# Patient Record
Sex: Male | Born: 1975 | Race: Black or African American | Hispanic: No | Marital: Single | Smoking: Current every day smoker
Health system: Southern US, Community
[De-identification: ages and names within clinical notes are randomized; demographics above are authoritative.]

## PROBLEM LIST (undated history)

## (undated) DIAGNOSIS — J45909 Unspecified asthma, uncomplicated: Secondary | ICD-10-CM

## (undated) HISTORY — PX: FEMUR FRACTURE SURGERY: SHX633

## (undated) HISTORY — PX: BACK SURGERY: SHX140

---

## 2020-05-02 ENCOUNTER — Emergency Department (HOSPITAL_COMMUNITY)
Admission: EM | Admit: 2020-05-02 | Discharge: 2020-05-02 | Disposition: A | Discharge status: Home / Self Care | Payer: Self-pay | Attending: Emergency Medicine | Admitting: Emergency Medicine

## 2020-05-02 ENCOUNTER — Other Ambulatory Visit: Payer: Self-pay

## 2020-05-02 ENCOUNTER — Encounter (HOSPITAL_COMMUNITY): Payer: Self-pay

## 2020-05-02 DIAGNOSIS — J45909 Unspecified asthma, uncomplicated: Secondary | ICD-10-CM | POA: Insufficient documentation

## 2020-05-02 DIAGNOSIS — H60502 Unspecified acute noninfective otitis externa, left ear: Secondary | ICD-10-CM | POA: Insufficient documentation

## 2020-05-02 HISTORY — DX: Unspecified asthma, uncomplicated: J45.909

## 2020-05-02 MED ORDER — AMOXICILLIN 500 MG PO TABS: 1000.0000 mg | ORAL_TABLET | Freq: Two times a day (BID) | ORAL | 0 refills | Status: AC

## 2020-05-02 MED ORDER — OFLOXACIN 0.3 % OP SOLN: 1.0000 [drp] | Freq: Four times a day (QID) | OPHTHALMIC | 0 refills | Status: DC

## 2020-05-02 NOTE — ED Triage Notes (Signed)
Pt reports left ear pain that began this morning.

## 2020-05-02 NOTE — ED Provider Notes (Signed)
[facility]-EMERGENCY DEPT Provider Note   CSN: [id number] Arrival date & time: 05/02/20  2220     History Chief Complaint  Patient presents with  . Otalgia    Paul Jones is a 45 y.o. male.  45 yo M with a chief complaint of left ear pain.  This started this evening.  The patient works in a [employer] and has to wear double here protection.  He thinks maybe his ear plug had gotten in an awkward position that caused him to have pain.  He denies any other foreign body to the ear.  Denies cough congestion or fever.  Denies trauma to the ear.  The history is provided by the patient.  Illness Severity:  Moderate Onset quality:  Gradual Duration:  1 day Timing:  Constant Progression:  Worsening Chronicity:  New Associated symptoms: ear pain   Associated symptoms: no abdominal pain, no chest pain, no congestion, no diarrhea, no fever, no headaches, no myalgias, no rash, no shortness of breath and no vomiting        Past Medical History:  Diagnosis Date  . Asthma     There are no problems to display for this patient.   History reviewed. No pertinent surgical history.     History reviewed. No pertinent family history.     Home Medications Prior to Admission medications   Medication Sig Start Date End Date Taking? Authorizing Provider  amoxicillin (AMOXIL) 500 MG tablet Take 2 tablets (1,000 mg total) by mouth 2 (two) times daily. 05/02/20  Yes Melene Plan, DO  ofloxacin (OCUFLOX) 0.3 % ophthalmic solution Place 1 drop into the left ear 4 (four) times daily. 05/02/20  Yes Melene Plan, DO    Allergies    Patient has no known allergies.  Review of Systems   Review of Systems  Constitutional: Negative for chills and fever.  HENT: Positive for ear pain. Negative for congestion and facial swelling.   Eyes: Negative for discharge and visual disturbance.  Respiratory: Negative for shortness of breath.   Cardiovascular: Negative for chest pain and  palpitations.  Gastrointestinal: Negative for abdominal pain, diarrhea and vomiting.  Musculoskeletal: Negative for arthralgias and myalgias.  Skin: Negative for color change and rash.  Neurological: Negative for tremors, syncope and headaches.  Psychiatric/Behavioral: Negative for confusion and dysphoric mood.    Physical Exam Updated Vital Signs BP 107/75 (BP Location: Left Arm)   Pulse 97   Temp 98.1 F (36.7 C) (Oral)   Resp 16   SpO2 97%   Physical Exam Vitals and nursing note reviewed.  Constitutional:      Appearance: He is well-developed and well-nourished.  HENT:     Head: Normocephalic and atraumatic.     Ears:     Comments: Mild erythema to the right external ear canal and the tympanic membrane without obvious effusion or change in landmarks.  Left external ear canal with some erythema and edema compared to the right.  No obvious discharge.  The left tympanic membrane with a purulent appearing effusion with some distortion of landmarks. Eyes:     Extraocular Movements: EOM normal.     Pupils: Pupils are equal, round, and reactive to light.  Neck:     Vascular: No JVD.  Cardiovascular:     Rate and Rhythm: Normal rate and regular rhythm.     Heart sounds: No murmur heard. No friction rub. No gallop.   Pulmonary:     Effort: No respiratory distress.  Breath sounds: No wheezing.  Abdominal:     General: There is no distension.     Tenderness: There is no guarding or rebound.  Musculoskeletal:        General: Normal range of motion.     Cervical back: Normal range of motion and neck supple.  Skin:    Coloration: Skin is not pale.     Findings: No rash.  Neurological:     Mental Status: He is alert and oriented to person, place, and time.  Psychiatric:        Mood and Affect: Mood and affect normal.        Behavior: Behavior normal.     ED Results / Procedures / Treatments   Labs (all labs ordered are listed, but only abnormal results are  displayed) Labs Reviewed - No data to display  EKG None  Radiology No results found.  Procedures Procedures   Medications Ordered in ED Medications - No data to display  ED Course  I have reviewed the triage vital signs and the nursing notes.  Pertinent labs & imaging results that were available during my care of the patient were reviewed by me and considered in my medical decision making (see chart for details).    MDM Rules/Calculators/A&P                          44 yo M with a chief complaints of left ear pain.  Going on since this morning.  Patient with likely otitis externa but also some findings of otitis media.  Will start on oral and topical antibiotics.  Patient does not currently have a family physician in the area will give him ENT follow-up if needed.  11:10 PM:  I have discussed the diagnosis/risks/treatment options with the patient and believe the pt to be eligible for discharge home to follow-up with ENT. We also discussed returning to the ED immediately if new or worsening sx occur. We discussed the sx which are most concerning (e.g., sudden worsening pain, fever, inability to tolerate by mouth) that necessitate immediate return. Medications administered to the patient during their visit and any new prescriptions provided to the patient are listed below.  Medications given during this visit Medications - No data to display   The patient appears reasonably screen and/or stabilized for discharge and I doubt any other medical condition or other [facility] requiring further screening, evaluation, or treatment in the ED at this time prior to discharge.   Final Clinical Impression(s) / ED Diagnoses Final diagnoses:  Acute otitis externa of left ear, unspecified type    Rx / DC Orders ED Discharge Orders         Ordered    amoxicillin (AMOXIL) 500 MG tablet  2 times daily        05/02/20 2250    ofloxacin (OCUFLOX) 0.3 % ophthalmic solution  4 times daily         05/02/20 2250           Melene Plan, DO 05/02/20 2310

## 2020-05-02 NOTE — Discharge Instructions (Signed)
I provided you information to follow-up with the ear nose and throat doctor if your hearing worsens or if you are pain is not improved after a couple days.  You should also establish with a family doctor in the area.

## 2020-05-09 ENCOUNTER — Other Ambulatory Visit: Payer: Self-pay

## 2020-05-09 ENCOUNTER — Encounter (HOSPITAL_COMMUNITY): Payer: Self-pay | Admitting: Emergency Medicine

## 2020-05-09 ENCOUNTER — Emergency Department (HOSPITAL_COMMUNITY)
Admission: EM | Admit: 2020-05-09 | Discharge: 2020-05-09 | Disposition: A | Discharge status: Home / Self Care | Payer: Self-pay | Attending: Physician Assistant | Admitting: Physician Assistant

## 2020-05-09 DIAGNOSIS — H7292 Unspecified perforation of tympanic membrane, left ear: Secondary | ICD-10-CM | POA: Insufficient documentation

## 2020-05-09 DIAGNOSIS — H6692 Otitis media, unspecified, left ear: Secondary | ICD-10-CM | POA: Insufficient documentation

## 2020-05-09 DIAGNOSIS — Z79899 Other long term (current) drug therapy: Secondary | ICD-10-CM | POA: Insufficient documentation

## 2020-05-09 DIAGNOSIS — H6690 Otitis media, unspecified, unspecified ear: Secondary | ICD-10-CM

## 2020-05-09 DIAGNOSIS — J45909 Unspecified asthma, uncomplicated: Secondary | ICD-10-CM | POA: Insufficient documentation

## 2020-05-09 DIAGNOSIS — F17210 Nicotine dependence, cigarettes, uncomplicated: Secondary | ICD-10-CM | POA: Insufficient documentation

## 2020-05-09 MED ORDER — HYDROCODONE-ACETAMINOPHEN 5-325 MG PO TABS
1.0000 | ORAL_TABLET | Freq: Once | ORAL | Status: AC
  Administered 2020-05-09: 1 via ORAL
  Filled 2020-05-09: qty 1

## 2020-05-09 MED ORDER — HYDROCODONE-ACETAMINOPHEN 5-325 MG PO TABS: 1.0000 | ORAL_TABLET | ORAL | 0 refills | Status: AC | PRN

## 2020-05-09 MED ORDER — LEVOFLOXACIN 500 MG PO TABS: 500.0000 mg | ORAL_TABLET | Freq: Every day | ORAL | 0 refills | Status: AC

## 2020-05-09 MED ORDER — LEVOFLOXACIN 500 MG PO TABS
500.0000 mg | ORAL_TABLET | Freq: Once | ORAL | Status: AC
  Administered 2020-05-09: 500 mg via ORAL
  Filled 2020-05-09: qty 1

## 2020-05-09 NOTE — Discharge Instructions (Addendum)
Take the new antibiotics prescribed.  Call tomorrow to schedule appointment with the ENT provider.  I have written you for a short course of pain management.  Do not drive or operate heavy machinery with this.  As discussed in room.  I written out for a few days of work due to not able to put your ear buds in for work.

## 2020-05-09 NOTE — ED Provider Notes (Signed)
[facility]-EMERGENCY DEPT Provider Note   CSN: [id number] Arrival date & time: 05/09/20  2150    History Chief Complaint  Patient presents with   Otalgia    Paul Jones is a 45 y.o. male with past history significant for asthma presents for evaluation of left ear pain.  Seen here last week, diagnosed with otitis media.  Started on Augmentin as well as eardrops.  States continued to have pain to his left ear.  His pain began after he had dropped his ear buds at work.  He chronically numb however has been using them back in his years.  He is not any drainage to his ears.  He has no neck pain, neck stiffness, facial pain, facial swelling, headache, lightheadedness, dizziness, neck stiffness or neck rigidity.  No pain over mastoid.  He is able to tolerate p.o. today without difficulty.  Taken Tylenol without relief of pain.  No recent swimming activities.  Has not followed up with ENT.  Rates his pain a 7/10.  Denies additional aggravating or alleviating factors.  History obtained from patient and past medical records. No interpretor used.  HPI     Past Medical History:  Diagnosis Date   Asthma     There are no problems to display for this patient.   History reviewed. No pertinent surgical history.     History reviewed. No pertinent family history.  Social History   Tobacco Use   Smoking status: Current Every Day Smoker   Smokeless tobacco: Never Used  Vaping Use   Vaping Use: Never used  Substance Use Topics   Alcohol use: Never   Drug use: Never    Home Medications Prior to Admission medications   Medication Sig Start Date End Date Taking? Authorizing Provider  HYDROcodone-acetaminophen (NORCO/VICODIN) 5-325 MG tablet Take 1 tablet by mouth every 4 (four) hours as needed. 05/09/20  Yes Raeana Blinn A, PA-C  levofloxacin (LEVAQUIN) 500 MG tablet Take 1 tablet (500 mg total) by mouth daily for 7 days. 05/09/20 05/16/20 Yes Shanikia Kernodle  A, PA-C  amoxicillin (AMOXIL) 500 MG tablet Take 2 tablets (1,000 mg total) by mouth 2 (two) times daily. 05/02/20   Melene Plan, DO  ofloxacin (OCUFLOX) 0.3 % ophthalmic solution Place 1 drop into the left ear 4 (four) times daily. 05/02/20   Melene Plan, DO    Allergies    Patient has no known allergies.  Review of Systems   Review of Systems  Constitutional: Negative.   HENT: Positive for ear pain. Negative for congestion, dental problem, facial swelling, nosebleeds, postnasal drip, rhinorrhea, sinus pressure, sinus pain, sore throat, trouble swallowing and voice change.   Respiratory: Negative.   Cardiovascular: Negative.   Gastrointestinal: Negative.   Genitourinary: Negative.   Musculoskeletal: Negative.   Skin: Negative.   Neurological: Negative.   All other systems reviewed and are negative.  Physical Exam Updated Vital Signs BP 121/82 (BP Location: Right Arm)    Pulse 90    Temp 98.2 F (36.8 C) (Oral)    Resp 18    Ht 5\' 6"  (1.676 m)    Wt 113.4 kg    SpO2 100%    BMI 40.35 kg/m   Physical Exam Vitals and nursing note reviewed.  Constitutional:      General: He is not in acute distress.    Appearance: He is well-developed and well-nourished. He is not ill-appearing, toxic-appearing or diaphoretic.  HENT:     Head: Normocephalic and atraumatic.  Jaw: There is normal jaw occlusion.     Right Ear: Tympanic membrane, ear canal and external ear normal. There is no impacted cerumen.     Left Ear: Tenderness present. No foreign body. No mastoid tenderness. No PE tube. No hemotympanum. Tympanic membrane is perforated and erythematous.     Ears:     Comments: Perforated TM on left at 9 o'clock position. White cloudy TM on right, distortion of landmarks. No tenderness to mastoid.  Tenderness with palpation of tragus    Mouth/Throat:     Lips: Pink.     Mouth: Mucous membranes are moist.     Pharynx: Oropharynx is clear. Uvula midline.     Comments: Posterior oropharynx  clear.  No evidence of PTA or RPA. Eyes:     Pupils: Pupils are equal, round, and reactive to light.  Neck:     Trachea: Trachea and phonation normal.     Comments: No neck stiffness or neck rigidity Cardiovascular:     Rate and Rhythm: Normal rate and regular rhythm.  Pulmonary:     Effort: Pulmonary effort is normal. No respiratory distress.  Abdominal:     General: There is no distension.     Palpations: Abdomen is soft.  Musculoskeletal:        General: Normal range of motion.     Cervical back: Full passive range of motion without pain, normal range of motion and neck supple.  Skin:    General: Skin is warm and dry.     Capillary Refill: Capillary refill takes less than 2 seconds.     Comments: No edema, erythema or warmth  Neurological:     Mental Status: He is alert.     Cranial Nerves: Cranial nerves are intact.     Sensory: Sensation is intact.     Motor: Motor function is intact.     Comments: Cranial nerves II through XII grossly intact  Psychiatric:        Mood and Affect: Mood and affect normal.     ED Results / Procedures / Treatments   Labs (all labs ordered are listed, but only abnormal results are displayed) Labs Reviewed - No data to display  EKG None  Radiology No results found.  Procedures Procedures   Medications Ordered in ED Medications  HYDROcodone-acetaminophen (NORCO/VICODIN) 5-325 MG per tablet 1 tablet (1 tablet Oral Given 05/09/20 2253)  levofloxacin (LEVAQUIN) tablet 500 mg (500 mg Oral Given 05/09/20 2253)    ED Course  I have reviewed the triage vital signs and the nursing notes.  Pertinent labs & imaging results that were available during my care of the patient were reviewed by me and considered in my medical decision making (see chart for details).  Patient here for recurrent ear pain on left.  Seen here 1 week ago started on Augmentin, drops.  No neck stiffness neck rigidity.  No meningismus.  No tenderness to palpation of  mastoid.  Right ear clear.  Left ear with some very minimal mid canal edema, does have perforated left TM at 9 o'clock position.  Cloudy and white TM.  She does not appear septic.  Low suspicion for meningitis, mastoiditis, dissection, abscess.  Will change antibiotics, pain control, follow-up with ENT.  Patient is agreeable for this.  Given first dose of antibiotics here in ED.  Patient was referred to Dr. Pollyann Kennedy on previous ED visit.  Encouraged to follow-up with him.  The patient has been appropriately medically screened and/or stabilized in  the ED. I have low suspicion for any other emergent medical condition which would require further screening, evaluation or treatment in the ED or require inpatient management.  Patient is hemodynamically stable and in no acute distress.  Patient able to ambulate in department prior to ED.  Evaluation does not show acute pathology that would require ongoing or additional emergent interventions while in the emergency department or further inpatient treatment.  I have discussed the diagnosis with the patient and answered all questions.  Pain is been managed while in the emergency department and patient has no further complaints prior to discharge.  Patient is comfortable with plan discussed in room and is stable for discharge at this time.  I have discussed strict return precautions for returning to the emergency department.  Patient was encouraged to follow-up with PCP/specialist refer to at discharge.      MDM Rules/Calculators/A&P                          Final Clinical Impression(s) / ED Diagnoses Final diagnoses:  Acute otitis media, unspecified otitis media type    Rx / DC Orders ED Discharge Orders         Ordered    HYDROcodone-acetaminophen (NORCO/VICODIN) 5-325 MG tablet  Every 4 hours PRN        05/09/20 2256    levofloxacin (LEVAQUIN) 500 MG tablet  Daily        05/09/20 2256           Anyelin Mogle A, PA-C 05/09/20 2256    Lorre Nick, MD 05/13/20 [phone]

## 2020-05-09 NOTE — ED Triage Notes (Signed)
Patient complaining of left ear pain that he has been taking antibiotics and ear drops for the ear infection. Patient states that the pain has not gotten any better.

## 2020-05-24 ENCOUNTER — Encounter (HOSPITAL_COMMUNITY): Payer: Self-pay

## 2020-05-24 ENCOUNTER — Emergency Department (HOSPITAL_COMMUNITY): Payer: Self-pay

## 2020-05-24 ENCOUNTER — Emergency Department (HOSPITAL_COMMUNITY)
Admission: EM | Admit: 2020-05-24 | Discharge: 2020-05-24 | Disposition: A | Discharge status: Home / Self Care | Payer: Self-pay | Attending: Emergency Medicine | Admitting: Emergency Medicine

## 2020-05-24 ENCOUNTER — Other Ambulatory Visit: Payer: Self-pay

## 2020-05-24 DIAGNOSIS — J45909 Unspecified asthma, uncomplicated: Secondary | ICD-10-CM | POA: Insufficient documentation

## 2020-05-24 DIAGNOSIS — H6092 Unspecified otitis externa, left ear: Secondary | ICD-10-CM | POA: Insufficient documentation

## 2020-05-24 DIAGNOSIS — T162XXA Foreign body in left ear, initial encounter: Secondary | ICD-10-CM | POA: Insufficient documentation

## 2020-05-24 DIAGNOSIS — X58XXXA Exposure to other specified factors, initial encounter: Secondary | ICD-10-CM | POA: Insufficient documentation

## 2020-05-24 DIAGNOSIS — F17290 Nicotine dependence, other tobacco product, uncomplicated: Secondary | ICD-10-CM | POA: Insufficient documentation

## 2020-05-24 DIAGNOSIS — H60502 Unspecified acute noninfective otitis externa, left ear: Secondary | ICD-10-CM

## 2020-05-24 LAB — CBC
HCT: 47.6 % (ref 39.0–52.0)
Hemoglobin: 16 g/dL (ref 13.0–17.0)
MCH: 28.5 pg (ref 26.0–34.0)
MCHC: 33.6 g/dL (ref 30.0–36.0)
MCV: 84.8 fL (ref 80.0–100.0)
Platelets: 365 10*3/uL (ref 150–400)
RBC: 5.61 MIL/uL (ref 4.22–5.81)
RDW: 15.9 % — ABNORMAL HIGH (ref 11.5–15.5)
WBC: 12.1 10*3/uL — ABNORMAL HIGH (ref 4.0–10.5)
nRBC: 0 % (ref 0.0–0.2)

## 2020-05-24 LAB — BASIC METABOLIC PANEL
Anion gap: 10 (ref 5–15)
BUN: 16 mg/dL (ref 6–20)
CO2: 23 mmol/L (ref 22–32)
Calcium: 9.4 mg/dL (ref 8.9–10.3)
Chloride: 105 mmol/L (ref 98–111)
Creatinine, Ser: 1.03 mg/dL (ref 0.61–1.24)
GFR, Estimated: >60 mL/min (ref >60)
Glucose, Bld: 97 mg/dL (ref 70–99)
Potassium: 4.3 mmol/L (ref 3.5–5.1)
Sodium: 138 mmol/L (ref 135–145)

## 2020-05-24 LAB — TROPONIN I (HIGH SENSITIVITY): Troponin I (High Sensitivity): 8 ng/L (ref <18)

## 2020-05-24 MED ORDER — NAPROXEN 500 MG PO TABS: 500.0000 mg | ORAL_TABLET | Freq: Two times a day (BID) | ORAL | 0 refills | Status: AC

## 2020-05-24 MED ORDER — OFLOXACIN 0.3 % OP SOLN: 1.0000 [drp] | Freq: Four times a day (QID) | OPHTHALMIC | 0 refills | Status: AC

## 2020-05-24 NOTE — ED Provider Notes (Signed)
[facility]-EMERGENCY DEPT Provider Note   CSN: [id number] Arrival date & time: 05/24/20  1847     History Chief Complaint  Patient presents with  . Chest Pain    Paul Jones is a 45 y.o. male.  HPI    45 year old male comes in with chief complaint of chest pain.  He reports that over the last several days he has been having left-sided ear pain.  He was given Augmentin, the pain persisted so he returned and was told that he had perforated ear.  He has not seen the ENT due to lack of insurance.  Today he had some tingling in his left hand followed by chest pain.  Chest pain was right-sided and has since resolved.  Patient has been having tingling in his left upper extremity ever since he has been having ear issues.  Patient has history of bronchitis/asthma but denies any new cough, chest tightness with breathing, wheezing.  He denies any substance abuse, hypertension, high cholesterol, diabetes.  He is overweight and was told he has prediabetic.  Past Medical History:  Diagnosis Date  . Asthma     There are no problems to display for this patient.   Past Surgical History:  Procedure Laterality Date  . BACK SURGERY    . FEMUR FRACTURE SURGERY         Family History  Problem Relation Age of Onset  . Hypertension Mother     Social History   Tobacco Use  . Smoking status: Current Every Day Smoker    Types: Cigars  . Smokeless tobacco: Never Used  Vaping Use  . Vaping Use: Never used  Substance Use Topics  . Alcohol use: Never  . Drug use: Never    Home Medications Prior to Admission medications   Medication Sig Start Date End Date Taking? Authorizing Provider  naproxen (NAPROSYN) 500 MG tablet Take 1 tablet (500 mg total) by mouth 2 (two) times daily. 05/24/20  Yes Derwood Kaplan, MD  amoxicillin (AMOXIL) 500 MG tablet Take 2 tablets (1,000 mg total) by mouth 2 (two) times daily. Patient not taking: Reported on 05/24/2020 05/02/20   Melene Plan, DO  HYDROcodone-acetaminophen (NORCO/VICODIN) 5-325 MG tablet Take 1 tablet by mouth every 4 (four) hours as needed. Patient not taking: Reported on 05/24/2020 05/09/20   Henderly, Britni A, PA-C  ofloxacin (OCUFLOX) 0.3 % ophthalmic solution Place 1 drop into the left ear 4 (four) times daily. 05/24/20   Derwood Kaplan, MD    Allergies    Patient has no known allergies.  Review of Systems   Review of Systems  Constitutional: Positive for activity change.  HENT: Positive for ear pain. Negative for ear discharge.   Respiratory: Negative for shortness of breath.   Cardiovascular: Negative for chest pain.  Gastrointestinal: Negative for nausea and vomiting.  Hematological: Does not bruise/bleed easily.  All other systems reviewed and are negative.   Physical Exam Updated Vital Signs BP 112/84 (BP Location: Right Arm)   Pulse 97   Temp 97.6 F (36.4 C) (Oral)   Resp 20   Ht 5\' 6"  (1.676 m)   Wt 113.4 kg   SpO2 98%   BMI 40.35 kg/m   Physical Exam Vitals and nursing note reviewed.  Constitutional:      Appearance: He is well-developed.  HENT:     Head: Normocephalic and atraumatic.     Comments: Left ear has foreign body abutting the view of TM. Eyes:  Conjunctiva/sclera: Conjunctivae normal.     Pupils: Pupils are equal, round, and reactive to light.  Cardiovascular:     Rate and Rhythm: Normal rate and regular rhythm.     Heart sounds: Normal heart sounds.  Pulmonary:     Effort: Pulmonary effort is normal. No respiratory distress.     Breath sounds: Normal breath sounds. No wheezing.  Abdominal:     General: Bowel sounds are normal. There is no distension.     Palpations: Abdomen is soft.     Tenderness: There is no abdominal tenderness. There is no guarding or rebound.  Musculoskeletal:     Cervical back: Normal range of motion and neck supple.  Skin:    General: Skin is warm.  Neurological:     Mental Status: He is alert and oriented to person, place,  and time.     ED Results / Procedures / Treatments   Labs (all labs ordered are listed, but only abnormal results are displayed) Labs Reviewed  CBC - Abnormal; Notable for the following components:      Result Value   WBC 12.1 (*)    RDW 15.9 (*)    All other components within normal limits  BASIC METABOLIC PANEL  TROPONIN I (HIGH SENSITIVITY)  TROPONIN I (HIGH SENSITIVITY)    EKG EKG Interpretation  Date/Time:  Friday May 24 2020 19:02:19 EST Ventricular Rate:  105 PR Interval:    QRS Duration: 86 QT Interval:  331 QTC Calculation: 438 R Axis:   35 Text Interpretation: Sinus tachycardia No acute changes No acute changes No old tracing to compare Confirmed by Derwood Kaplan [phone]) on 05/24/2020 7:54:59 PM   Radiology DG Chest 2 View  Result Date: 05/24/2020 CLINICAL DATA:  Intermittent left and central chest pain. EXAM: CHEST - 2 VIEW COMPARISON:  Radiograph 03/07/2019 FINDINGS: The cardiomediastinal contours are normal. Mild bronchial thickening. Pulmonary vasculature is normal. No consolidation, pleural effusion, or pneumothorax. No acute osseous abnormalities are seen. IMPRESSION: Mild bronchial thickening may be related to bronchitis, asthma, or smoking. Electronically Signed   By: Narda Rutherford M.D.   On: 05/24/2020 19:49    Procedures .Foreign Body Removal  Date/Time: 05/24/2020 10:19 PM Performed by: Derwood Kaplan, MD Authorized by: Derwood Kaplan, MD  Consent: Verbal consent obtained. Risks and benefits: risks, benefits and alternatives were discussed Consent given by: patient Patient understanding: patient states understanding of the procedure being performed Patient identity confirmed: arm band Time out: Immediately prior to procedure a "time out" was called to verify the correct patient, procedure, equipment, support staff and site/side marked as required. Body area: ear Location details: left ear  Sedation: Patient sedated: no  Patient  restrained: no Patient cooperative: yes Localization method: ENT speculum and magnification Removal mechanism: curette Complexity: simple 1 objects recovered. Objects recovered: Earplug/earbud cover Post-procedure assessment: foreign body removed Patient tolerance: patient tolerated the procedure well with no immediate complications     Medications Ordered in ED Medications - No data to display  ED Course  I have reviewed the triage vital signs and the nursing notes.  Pertinent labs & imaging results that were available during my care of the patient were reviewed by me and considered in my medical decision making (see chart for details).    MDM Rules/Calculators/A&P                          Patient seen in the ER for chest pain and ear discomfort.  Chest pain is nonspecific. Hear score is 1  Delta troponin ordered. No evidence of any pulmonary issues either.  X-ray ordered. EKG is reassuring.  Patient has a foreign body to the ear that was removed.  After the removal of the foreign body, patient has some erosion and debris over the external ear canal of the left side.  We will put him on topical antibiotic.  I do not see any evidence of perforated eardrum, advised follow-up with ENT surgeon.  Fortunately, patient is not having any tinnitus, drainage from the ear, " whooshing noise" see if he had a perforated TM it appears that it might have healed.  10:22 PM Patient's right has to go home, he wants to go home therefore we have discharged him with single troponin which is negative..  Final Clinical Impression(s) / ED Diagnoses Final diagnoses:  Acute otitis externa of left ear, unspecified type  Foreign body of left ear, initial encounter    Rx / DC Orders ED Discharge Orders         Ordered    ofloxacin (OCUFLOX) 0.3 % ophthalmic solution  4 times daily        05/24/20 2154    naproxen (NAPROSYN) 500 MG tablet  2 times daily        05/24/20 2154            Derwood Kaplan, MD 05/24/20 2222

## 2020-05-24 NOTE — ED Triage Notes (Signed)
Patient c/o intermittent left chest pain since yesterday and tingling of his fingers. Patient states he took an antacid and the pain subsided for a while.  patient states he was seen 4 days ago for otalgia and was referred to an ENT, but has not seen as of yet due to co pay.

## 2020-05-24 NOTE — Discharge Instructions (Signed)
Take the medications prescribed for what appears to be otitis externa.  It is prudent that you follow-up with the ENT doctors to ensure that the ear is healing well.

## 2021-05-28 IMAGING — CR DG CHEST 2V
2 series · 2 of 2 positions shown · non-contrast
Comparison: Radiograph 03/07/2019

CLINICAL DATA: Intermittent left and central chest pain.

EXAM:
CHEST - 2 VIEW

[w chest pa]
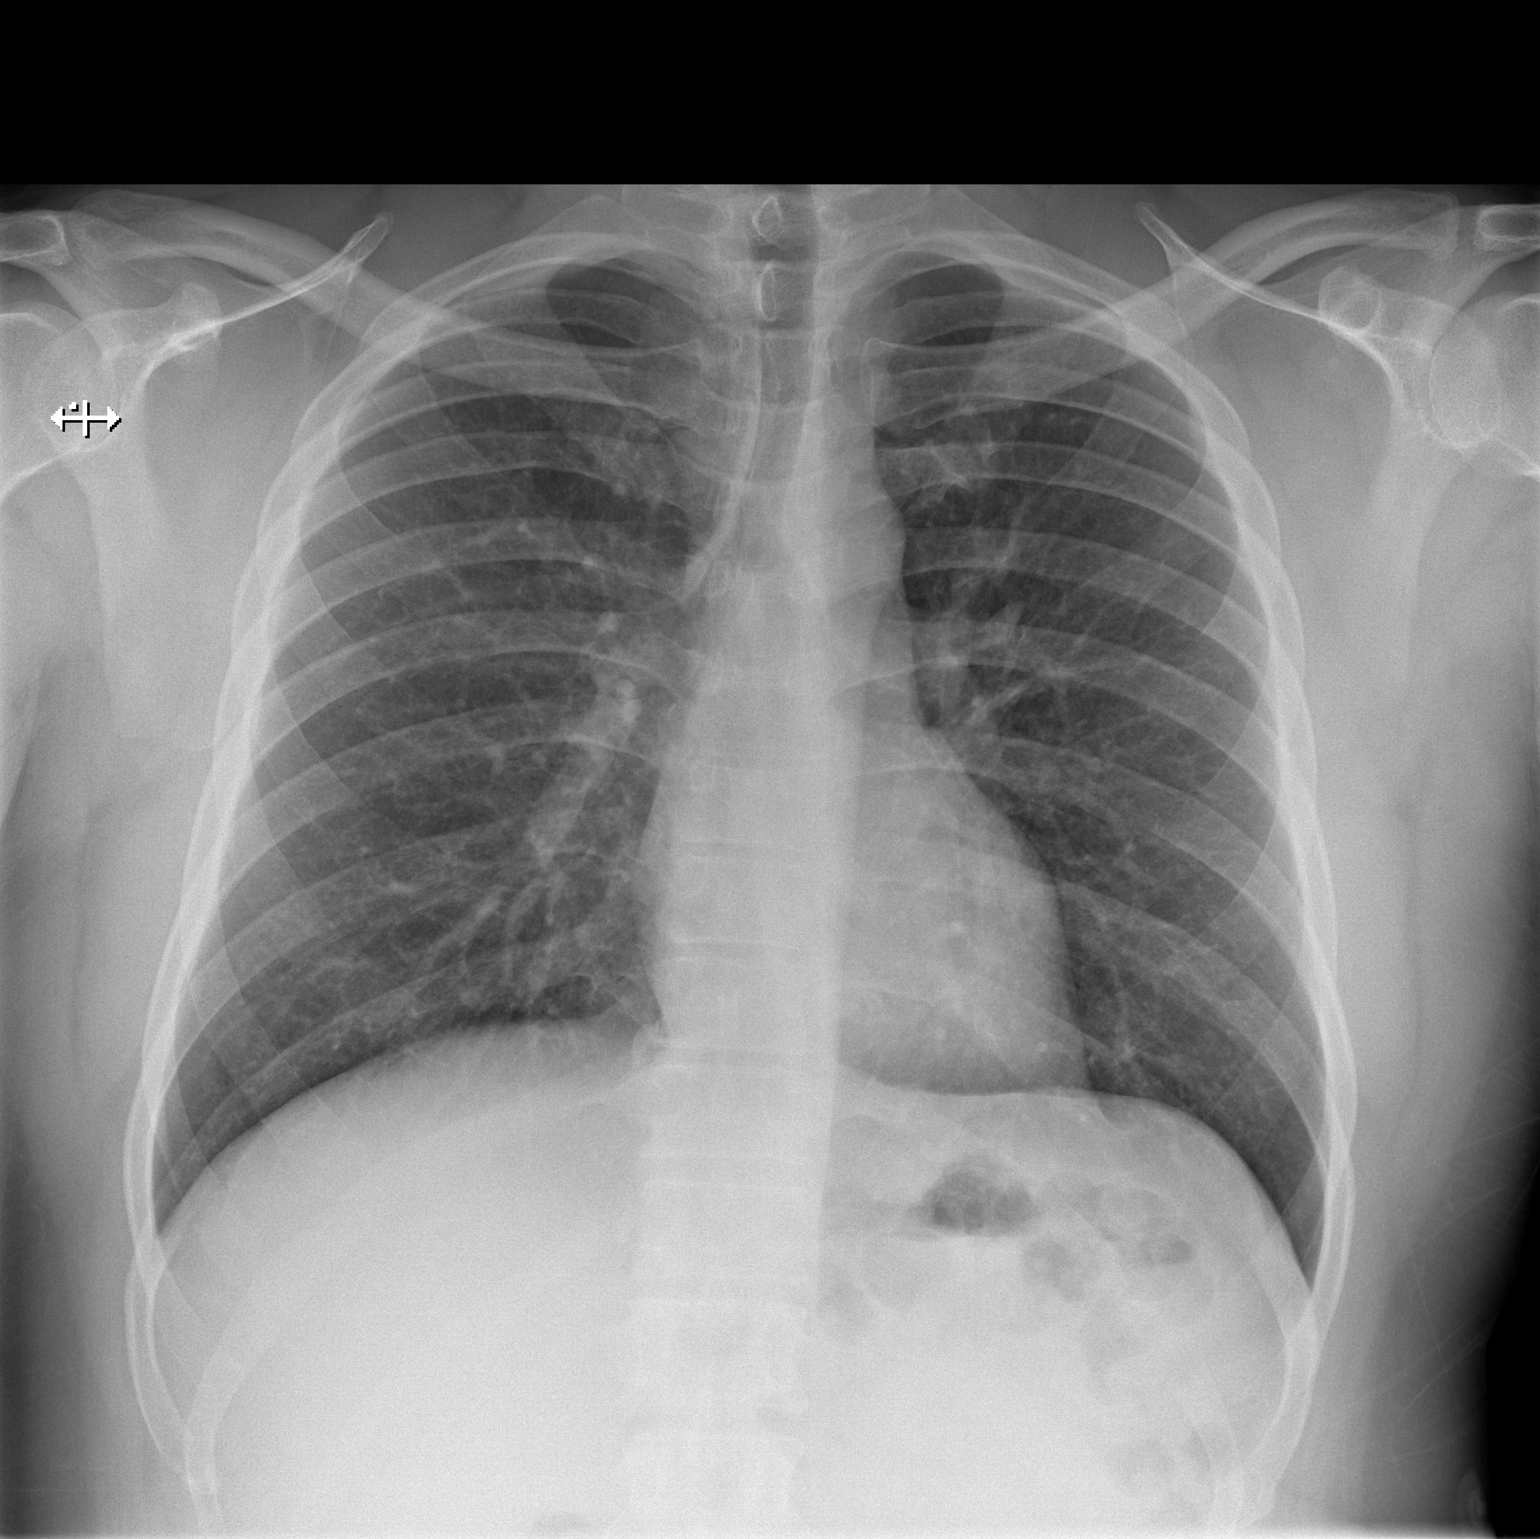

[w chest lat]
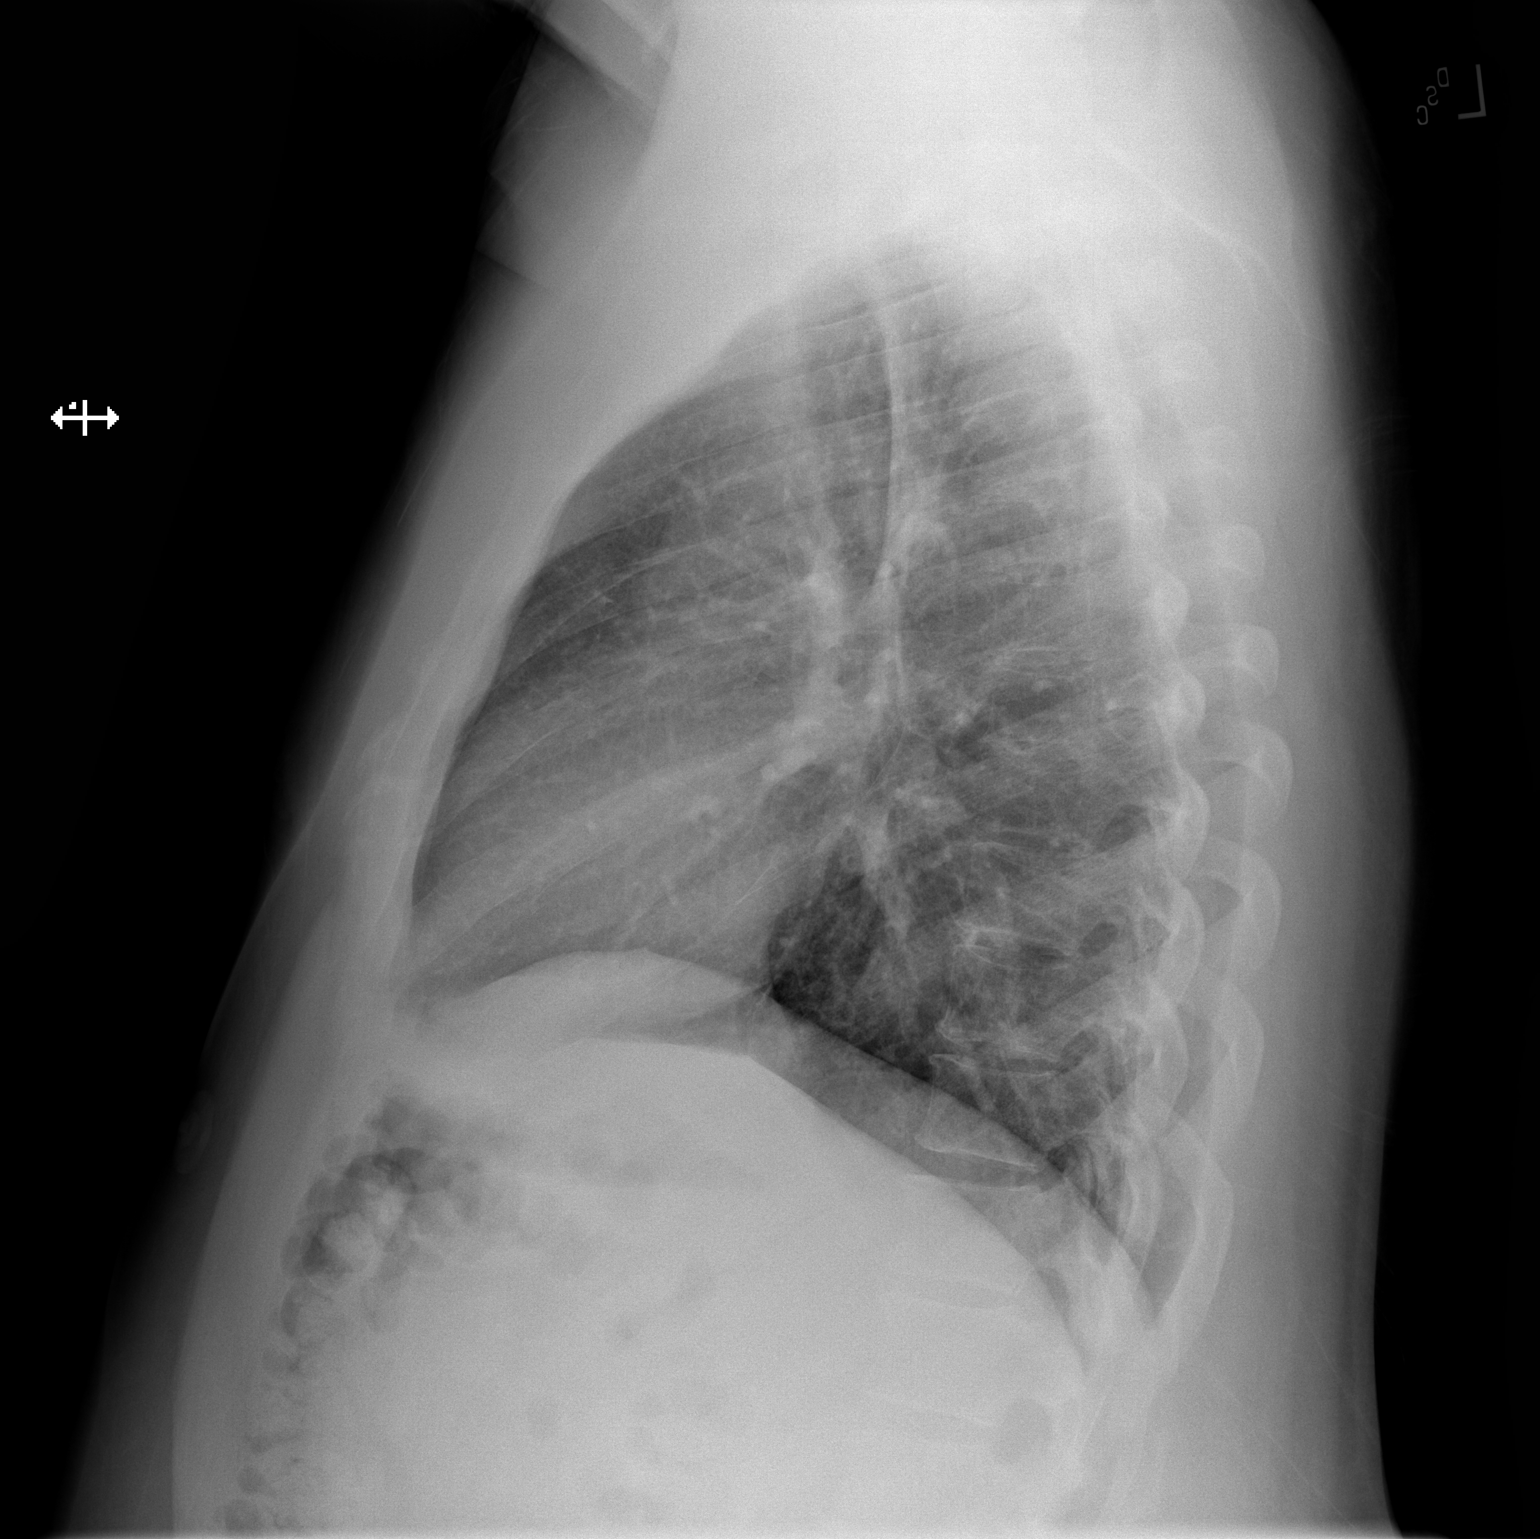

[2 of 2 positions shown; findings below may reference images not displayed]

FINDINGS: The cardiomediastinal contours are normal. Mild bronchial
thickening. Pulmonary vasculature is normal. No consolidation,
pleural effusion, or pneumothorax. No acute osseous abnormalities
are seen.
IMPRESSION: Mild bronchial thickening may be related to bronchitis, asthma, or
smoking.
